# Patient Record
Sex: Male | Born: 1970 | Race: White | Hispanic: No | Marital: Married | Smoking: Current every day smoker
Health system: Southern US, Community
[De-identification: ages and names within clinical notes are randomized; demographics above are authoritative.]

## PROBLEM LIST (undated history)

## (undated) DIAGNOSIS — J449 Chronic obstructive pulmonary disease, unspecified: Secondary | ICD-10-CM

## (undated) HISTORY — PX: NECK DISSECTION: SUR422

## (undated) HISTORY — DX: Chronic obstructive pulmonary disease, unspecified: J44.9

## (undated) HISTORY — PX: BACK SURGERY: SHX140

---

## 2000-02-14 ENCOUNTER — Emergency Department (HOSPITAL_COMMUNITY): Admission: EM | Admit: 2000-02-14 | Discharge: 2000-02-14 | Payer: Self-pay | Admitting: Emergency Medicine

## 2000-02-14 ENCOUNTER — Encounter: Payer: Self-pay | Admitting: Emergency Medicine

## 2000-05-19 ENCOUNTER — Emergency Department (HOSPITAL_COMMUNITY): Admission: EM | Admit: 2000-05-19 | Discharge: 2000-05-19 | Payer: Self-pay | Admitting: Emergency Medicine

## 2001-09-01 ENCOUNTER — Emergency Department (HOSPITAL_COMMUNITY): Admission: EM | Admit: 2001-09-01 | Discharge: 2001-09-01 | Payer: Self-pay | Admitting: Emergency Medicine

## 2001-09-01 ENCOUNTER — Encounter: Payer: Self-pay | Admitting: Emergency Medicine

## 2002-08-13 ENCOUNTER — Encounter: Payer: Self-pay | Admitting: Emergency Medicine

## 2002-08-13 ENCOUNTER — Emergency Department (HOSPITAL_COMMUNITY): Admission: EM | Admit: 2002-08-13 | Discharge: 2002-08-13 | Payer: Self-pay

## 2003-05-21 ENCOUNTER — Emergency Department (HOSPITAL_COMMUNITY): Admission: EM | Admit: 2003-05-21 | Discharge: 2003-05-21 | Payer: Self-pay | Admitting: Emergency Medicine

## 2005-01-22 ENCOUNTER — Emergency Department (HOSPITAL_COMMUNITY): Admission: EM | Admit: 2005-01-22 | Discharge: 2005-01-22 | Payer: Self-pay | Admitting: Emergency Medicine

## 2005-04-04 ENCOUNTER — Emergency Department (HOSPITAL_COMMUNITY): Admission: EM | Admit: 2005-04-04 | Discharge: 2005-04-04 | Payer: Self-pay | Admitting: Family Medicine

## 2005-04-28 ENCOUNTER — Emergency Department (HOSPITAL_COMMUNITY): Admission: EM | Admit: 2005-04-28 | Discharge: 2005-04-28 | Payer: Self-pay | Admitting: Family Medicine

## 2005-09-20 ENCOUNTER — Emergency Department (HOSPITAL_COMMUNITY): Admission: EM | Admit: 2005-09-20 | Discharge: 2005-09-20 | Payer: Self-pay | Admitting: Family Medicine

## 2006-07-04 ENCOUNTER — Emergency Department (HOSPITAL_COMMUNITY): Admission: EM | Admit: 2006-07-04 | Discharge: 2006-07-04 | Payer: Self-pay | Admitting: Family Medicine

## 2006-08-11 ENCOUNTER — Emergency Department (HOSPITAL_COMMUNITY): Admission: EM | Admit: 2006-08-11 | Discharge: 2006-08-11 | Payer: Self-pay | Admitting: Family Medicine

## 2008-01-14 ENCOUNTER — Emergency Department (HOSPITAL_BASED_OUTPATIENT_CLINIC_OR_DEPARTMENT_OTHER): Admission: EM | Admit: 2008-01-14 | Discharge: 2008-01-14 | Payer: Self-pay | Admitting: Emergency Medicine

## 2008-04-23 ENCOUNTER — Emergency Department (HOSPITAL_BASED_OUTPATIENT_CLINIC_OR_DEPARTMENT_OTHER): Admission: EM | Admit: 2008-04-23 | Discharge: 2008-04-23 | Payer: Self-pay | Admitting: Emergency Medicine

## 2008-04-23 ENCOUNTER — Ambulatory Visit: Payer: Self-pay | Admitting: Interventional Radiology

## 2008-10-10 ENCOUNTER — Encounter (INDEPENDENT_AMBULATORY_CARE_PROVIDER_SITE_OTHER): Payer: Self-pay | Admitting: Orthopedic Surgery

## 2008-10-10 ENCOUNTER — Ambulatory Visit: Payer: Self-pay | Admitting: Diagnostic Radiology

## 2008-10-10 ENCOUNTER — Encounter: Payer: Self-pay | Admitting: Emergency Medicine

## 2008-10-10 ENCOUNTER — Inpatient Hospital Stay (HOSPITAL_COMMUNITY): Admission: EM | Admit: 2008-10-10 | Discharge: 2008-10-11 | Payer: Self-pay | Admitting: Physician Assistant

## 2009-10-09 ENCOUNTER — Emergency Department (HOSPITAL_BASED_OUTPATIENT_CLINIC_OR_DEPARTMENT_OTHER): Admission: EM | Admit: 2009-10-09 | Discharge: 2009-10-09 | Payer: Self-pay | Admitting: Emergency Medicine

## 2009-12-18 ENCOUNTER — Emergency Department (HOSPITAL_BASED_OUTPATIENT_CLINIC_OR_DEPARTMENT_OTHER): Admission: EM | Admit: 2009-12-18 | Discharge: 2009-12-18 | Payer: Self-pay | Admitting: Emergency Medicine

## 2009-12-31 ENCOUNTER — Emergency Department (HOSPITAL_BASED_OUTPATIENT_CLINIC_OR_DEPARTMENT_OTHER): Admission: EM | Admit: 2009-12-31 | Discharge: 2009-12-31 | Payer: Self-pay | Admitting: Emergency Medicine

## 2009-12-31 ENCOUNTER — Ambulatory Visit: Payer: Self-pay | Admitting: Diagnostic Radiology

## 2010-01-19 ENCOUNTER — Emergency Department (HOSPITAL_BASED_OUTPATIENT_CLINIC_OR_DEPARTMENT_OTHER): Admission: EM | Admit: 2010-01-19 | Discharge: 2010-01-19 | Payer: Self-pay | Admitting: Emergency Medicine

## 2010-02-24 ENCOUNTER — Encounter: Admission: RE | Admit: 2010-02-24 | Discharge: 2010-02-24 | Payer: Self-pay | Admitting: Internal Medicine

## 2010-07-11 ENCOUNTER — Other Ambulatory Visit (HOSPITAL_COMMUNITY): Payer: Self-pay | Admitting: Orthopedic Surgery

## 2010-07-11 ENCOUNTER — Encounter (HOSPITAL_COMMUNITY): Payer: Self-pay

## 2010-07-11 ENCOUNTER — Encounter (HOSPITAL_COMMUNITY)
Admission: RE | Admit: 2010-07-11 | Discharge: 2010-07-11 | Disposition: A | Discharge status: Home / Self Care | Payer: Medicaid Other | Source: Ambulatory Visit | Attending: Orthopedic Surgery | Admitting: Orthopedic Surgery

## 2010-07-11 ENCOUNTER — Ambulatory Visit (HOSPITAL_COMMUNITY)
Admission: RE | Admit: 2010-07-11 | Discharge: 2010-07-11 | Disposition: A | Discharge status: Home / Self Care | Payer: Medicaid Other | Source: Ambulatory Visit | Attending: Orthopedic Surgery | Admitting: Orthopedic Surgery

## 2010-07-11 DIAGNOSIS — M5412 Radiculopathy, cervical region: Secondary | ICD-10-CM

## 2010-07-11 DIAGNOSIS — Z01812 Encounter for preprocedural laboratory examination: Secondary | ICD-10-CM | POA: Insufficient documentation

## 2010-07-11 LAB — CBC
HCT: 43.1 % (ref 39.0–52.0)
Hemoglobin: 14.9 g/dL (ref 13.0–17.0)
RBC: 4.65 MIL/uL (ref 4.22–5.81)
RDW: 11.6 % (ref 11.5–15.5)
WBC: 8.3 10*3/uL (ref 4.0–10.5)

## 2010-07-11 LAB — SURGICAL PCR SCREEN: MRSA, PCR: NEGATIVE

## 2010-07-12 ENCOUNTER — Inpatient Hospital Stay (HOSPITAL_COMMUNITY): Payer: Medicaid Other

## 2010-07-12 ENCOUNTER — Ambulatory Visit (HOSPITAL_COMMUNITY): Payer: Medicaid Other

## 2010-07-12 ENCOUNTER — Ambulatory Visit (HOSPITAL_COMMUNITY)
Admission: RE | Admit: 2010-07-12 | Discharge: 2010-07-13 | Disposition: A | Discharge status: Home / Self Care | Payer: Medicaid Other | Source: Ambulatory Visit | Attending: Orthopedic Surgery | Admitting: Orthopedic Surgery

## 2010-07-12 DIAGNOSIS — Z23 Encounter for immunization: Secondary | ICD-10-CM | POA: Insufficient documentation

## 2010-07-12 DIAGNOSIS — F17200 Nicotine dependence, unspecified, uncomplicated: Secondary | ICD-10-CM | POA: Insufficient documentation

## 2010-07-12 DIAGNOSIS — J449 Chronic obstructive pulmonary disease, unspecified: Secondary | ICD-10-CM | POA: Insufficient documentation

## 2010-07-12 DIAGNOSIS — J4489 Other specified chronic obstructive pulmonary disease: Secondary | ICD-10-CM | POA: Insufficient documentation

## 2010-07-12 DIAGNOSIS — M4802 Spinal stenosis, cervical region: Secondary | ICD-10-CM | POA: Insufficient documentation

## 2010-07-16 NOTE — Op Note (Signed)
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  OPERATIVE PROCEDURE:  Anterior cervical diskectomy and fusion C6-7.  COMPLICATIONS:  None.  CONDITION:  Stable.  INSTRUMENTATION SYSTEM USED:  A Synthes anterior cervical vector plate 12 mm in length with 16-mm locking screws and 8-mm high lordotic Titan intervertebral titanium spacer packed with DBX with cortical cancellous chips.  This is a very pleasant 40 year old gentleman who has been having severe debilitating right arm pain and neck pain.  The arm pain is in the C7 distribution.  Multiple attempts at conservative management consisting of physical therapy, injection therapy, observation, and failed to alleviate any of his symptoms.  He continued to have debilitating pain. As a result, we elected to proceed with surgery.  All appropriate risks, benefits, and alternatives to surgery were discussed with the patient and consent was obtained.  OPERATIVE NOTE:  The patient was brought to the operating room, placed supine on the operating table.  After successful induction of general anesthesia  endotracheal intubation, TED SCDs were applied and the anterior cervical spine was prepped and draped in a standard fashion. Preoperative time-out was then done to confirm patient procedure and affected extremity.  Once this was done, I then proceeded with a standard left-sided Clementeen Graham approach to cervical spine via a transverse incision.  I palpated  the cricoid cartilage, made a transverse incision starting at the midline and proceeding towards the left.  Sharp dissection was carried out down to and through the platysma and then identified the medial border sternocleidomastoid, continued my dissection along the medial border.  I identified the omohyoid and I swept it medially along with the esophagus and trachea.  I protected them with a thyroid retractor and then using Kittner dissectors, mobilized the prevertebral fascia so I could see the anterior longitudinal ligament.  I visualized the carotid sheath and protected that laterally with a finger.  At this point, I then placed a needle into the C6-7 disk space and confirmed that I was at the appropriate level.  Once this was done, I mobilized the longus coli muscles with bipolar electrocautery from the midbody of C6 to the midbody of C7 bilaterally.  I then placed Caspar retracting blades underneath the longus coli muscle and expanded them.  I did deflate and reinflate the endotracheal cuff during the expansion of the retractors.  Two distraction pins were placed, one in the body of C6, the other one in C7, distracted the 6-7 disk space.  An annulotomy was done using a 15-blade scalpel and then I used a combination of pituitary rongeurs, curettes, and Kerrison rongeurs to remove all the disk material.  I then undercut beneath the uncovertebral joint to trim down the osteophyte using a 2- and 1-mm Kerrison.  I did dissect through the posterior longitudinal ligament and used a 1-mm Kerrison to resect the posterior longitudinal ligament, so I could expose the anterior thecal sac and noted to have an adequate decompression.  Once the decompression was completed, I then rasped the endplates, so I had bleeding subchondral bone measured with trial devices.  The 8-mm lordotic spacer provided the best fit.  I then obtained this, packed with cortical cancellous bone chips, mixed with DBX and  malleted it to the appropriate depth.  Once this was done, I took a 12-mm anterior cervical vector plate and secured the plate to the bodies of C6 and C7 with 16-mm screws.  All screws were tight, torqued down to [employer] specifications.  The retractors were removed from the wound.  I checked these to ensure the esophagus was not entrapped beneath the plate, irrigated copiously with normal saline and then returned the trachea and esophagus to midline.  Final x-rays demonstrated satisfactory positioning of the hardware.  The platysma was reapproximated with interrupted 2-0 Vicryl sutures, 3-0 Monocryl for the skin.  Steri-Strips and dry dressing were applied.  The patient was extubated and transferred to PACU without incident.  At the end of the case, all needle and sponge counts were correct.     Alvy Beal, MD     DDB/MEDQ  D:  07/12/2010  T:  07/13/2010  Job:  [id number]  Electronically Signed by Venita Lick MD on 07/16/2010 05:21:12 PM

## 2010-07-26 LAB — SEDIMENTATION RATE: Sed Rate: 0 mm/hr (ref 0–16)

## 2010-07-27 LAB — HEPATIC FUNCTION PANEL
ALT: 13 U/L (ref 0–53)
AST: 17 U/L (ref 0–37)
Albumin: 3.4 g/dL — ABNORMAL LOW (ref 3.5–5.2)
Alkaline Phosphatase: 51 U/L (ref 39–117)
Bilirubin, Direct: 0 mg/dL (ref 0.0–0.3)
Total Bilirubin: 0.6 mg/dL (ref 0.3–1.2)

## 2010-07-27 LAB — BASIC METABOLIC PANEL
BUN: 9 mg/dL (ref 6–23)
Chloride: 106 mEq/L (ref 96–112)
Potassium: 4 mEq/L (ref 3.5–5.1)
Sodium: 140 mEq/L (ref 135–145)

## 2010-07-27 LAB — CBC
HCT: 41.5 % (ref 39.0–52.0)
Hemoglobin: 14.1 g/dL (ref 13.0–17.0)
MCV: 95.8 fL (ref 78.0–100.0)
RDW: 11.4 % — ABNORMAL LOW (ref 11.5–15.5)
WBC: 7.7 10*3/uL (ref 4.0–10.5)

## 2010-07-27 LAB — URINALYSIS, ROUTINE W REFLEX MICROSCOPIC
Bilirubin Urine: NEGATIVE
Glucose, UA: NEGATIVE mg/dL
Hgb urine dipstick: NEGATIVE
Ketones, ur: NEGATIVE mg/dL
Specific Gravity, Urine: 1.017 (ref 1.005–1.030)
pH: 6 (ref 5.0–8.0)

## 2010-07-27 LAB — DIFFERENTIAL
Basophils Absolute: 0.2 10*3/uL — ABNORMAL HIGH (ref 0.0–0.1)
Eosinophils Relative: 3 % (ref 0–5)
Lymphocytes Relative: 33 % (ref 12–46)
Lymphs Abs: 2.5 10*3/uL (ref 0.7–4.0)
Monocytes Absolute: 0.4 10*3/uL (ref 0.1–1.0)
Monocytes Relative: 5 % (ref 3–12)

## 2010-07-30 LAB — DIFFERENTIAL
Basophils Absolute: 0 10*3/uL (ref 0.0–0.1)
Basophils Relative: 1 % (ref 0–1)
Eosinophils Absolute: 0.2 10*3/uL (ref 0.0–0.7)
Eosinophils Relative: 2 % (ref 0–5)
Lymphs Abs: 1.8 10*3/uL (ref 0.7–4.0)
Neutrophils Relative %: 67 % (ref 43–77)

## 2010-07-30 LAB — ROCKY MTN SPOTTED FVR AB, IGM-BLOOD: RMSF IgM: 0.11 IV (ref 0.00–0.89)

## 2010-07-30 LAB — URINE CULTURE: Culture: NO GROWTH

## 2010-07-30 LAB — BASIC METABOLIC PANEL
BUN: 13 mg/dL (ref 6–23)
Chloride: 109 mEq/L (ref 96–112)
Creatinine, Ser: 0.9 mg/dL (ref 0.4–1.5)
Glucose, Bld: 126 mg/dL — ABNORMAL HIGH (ref 70–99)
Potassium: 4.3 mEq/L (ref 3.5–5.1)

## 2010-07-30 LAB — CBC
HCT: 42.9 % (ref 39.0–52.0)
MCV: 96 fL (ref 78.0–100.0)
Platelets: 233 10*3/uL (ref 150–400)
RDW: 11.8 % (ref 11.5–15.5)
WBC: 8.1 10*3/uL (ref 4.0–10.5)

## 2010-07-30 LAB — URINALYSIS, ROUTINE W REFLEX MICROSCOPIC
Glucose, UA: NEGATIVE mg/dL
Hgb urine dipstick: NEGATIVE
Protein, ur: NEGATIVE mg/dL
Specific Gravity, Urine: 1.011 (ref 1.005–1.030)
Urobilinogen, UA: 0.2 mg/dL (ref 0.0–1.0)

## 2010-07-30 LAB — ROCKY MTN SPOTTED FVR AB, IGG-BLOOD: RMSF IgG: 0.12 IV

## 2010-08-20 LAB — CBC
HCT: 36.1 % — ABNORMAL LOW (ref 39.0–52.0)
Hemoglobin: 12.3 g/dL — ABNORMAL LOW (ref 13.0–17.0)
MCHC: 34.2 g/dL (ref 30.0–36.0)
MCV: 95 fL (ref 78.0–100.0)
Platelets: 187 10*3/uL (ref 150–400)
RBC: 3.8 MIL/uL — ABNORMAL LOW (ref 4.22–5.81)
RDW: 11.9 % (ref 11.5–15.5)
WBC: 8.3 10*3/uL (ref 4.0–10.5)

## 2010-08-21 LAB — DIFFERENTIAL
Basophils Absolute: 0.3 10*3/uL — ABNORMAL HIGH (ref 0.0–0.1)
Basophils Relative: 2 % — ABNORMAL HIGH (ref 0–1)
Eosinophils Absolute: 0.6 10*3/uL (ref 0.0–0.7)
Monocytes Absolute: 1.5 10*3/uL — ABNORMAL HIGH (ref 0.1–1.0)
Monocytes Relative: 10 % (ref 3–12)
Neutrophils Relative %: 66 % (ref 43–77)

## 2010-08-21 LAB — ANAEROBIC CULTURE

## 2010-08-21 LAB — CBC
Hemoglobin: 14.4 g/dL (ref 13.0–17.0)
MCHC: 34.5 g/dL (ref 30.0–36.0)
MCV: 94.5 fL (ref 78.0–100.0)
RBC: 4.43 MIL/uL (ref 4.22–5.81)
RDW: 11.3 % — ABNORMAL LOW (ref 11.5–15.5)

## 2010-08-21 LAB — BASIC METABOLIC PANEL
CO2: 28 mEq/L (ref 19–32)
Chloride: 109 mEq/L (ref 96–112)
Creatinine, Ser: 1.2 mg/dL (ref 0.4–1.5)
GFR calc Af Amer: >60 mL/min (ref >60)
Glucose, Bld: 94 mg/dL (ref 70–99)

## 2010-08-21 LAB — WOUND CULTURE

## 2010-08-21 LAB — GRAM STAIN

## 2010-09-25 NOTE — Consult Note (Signed)
NAMEALDEAN, PIPE                 ACCOUNT NO.:  [id number]   MEDICAL RECORD NO.:  [id number]          PATIENT TYPE:  INP   LOCATION:  5017                         FACILITY:  [facility]   PHYSICIAN:  Artist Pais. Mina Marble, M.D.DATE OF BIRTH:  1970/07/12   DATE OF CONSULTATION:  10/10/2008  DATE OF DISCHARGE:                                 CONSULTATION   PHYSICIAN REQUESTING CONSULTATION:  Azalia Bilis, MD   The patient was actually transferred from [facility].   Mr. Terlizzi is a 40 year old right-hand dominant male, who was doing some  work with wood and putty-type materials and sustained a puncture wound  to the volar aspect of his dominant right long finger and presents with  a 48-hour history of pain and swelling of the flexor sheath.  He is 40  years old.   ALLERGIES:  He has an allergy to PENICILLIN.   He is not diabetic.   MEDICATIONS:  He is currently taking no medications.   SOCIAL HISTORY:  He smokes 2 packs a day.  No alcohol use.  No drug use.   FAMILY MEDICAL HISTORY:  Noncontributory.   PHYSICAL EXAMINATION:  GENERAL:  A well-developed and well-nourished  male, pleasant, alert, and oriented x3.  NEUROLOGIC:  He has pain and swelling of the right long finger from the  DIP joint to the middle of the proximal phalanx.  Kanaval signs are  positive.  Pain with passive extension, pain over the flexor sheath  itself, swelling, etc.   LABORATORY DATA:  He has a white count at 15.6, and his x-rays show soft  tissue swelling only.   IMPRESSION:  A 40 year old male with an obvious flexor sheath infection,  dominant right long finger.  I discussed with both Mr. Lackey and his  wife.  Recommendations at this point in time, an emergent trip to the  operating room for I and D culturing, IV antibiotics, possible re-I and  D as necessary versus primary closure over a drain versus placement of  an indwelling catheter.  They understood the risks and benefits and the  need  for emergent surgery.  We will proceed as soon as possible.      Artist Pais Mina Marble, M.D.  Electronically Signed     MAW/MEDQ  D:  10/10/2008  T:  10/11/2008  Job:  [id number]

## 2010-09-25 NOTE — Op Note (Signed)
NAMEALFARD, COCHRANE                 ACCOUNT NO.:  [id number]   MEDICAL RECORD NO.:  [id number]          PATIENT TYPE:  INP   LOCATION:  5017                         FACILITY:  [facility]   PHYSICIAN:  Artist Pais. Weingold, M.D.DATE OF BIRTH:  06/19/1970   DATE OF PROCEDURE:  10/10/2008  DATE OF DISCHARGE:                               OPERATIVE REPORT   PREOPERATIVE DIAGNOSIS:  Right long finger flexor sheath infection.   POSTOPERATIVE DIAGNOSIS:  Right long finger flexor sheath infection.   PROCEDURE:  I and D of above with culturing.   SURGEON:  Artist Pais. Mina Marble, MD   ASSISTANT:  None.   ANESTHESIA:  General.   TOURNIQUET TIME:  26 minutes.   No complications.   No drains.   OPERATIVE REPORT:  The patient was taken to the operating suite.  After  the induction of adequate general anesthesia, right upper extremity was  prepped and draped in sterile fashion.  Esmarch was used to exsanguinate  the limb.  Tourniquet was then inflated to 250 mmHg.  At this point in  time, a Brunner incision was made over the middle phalanx of the right  long finger, extending proximally and distally to the midline.  Skin was  incised.  Cloudy fluid was encountered off the flexor sheath.  This was  cultured.  Dissection was carried down the flexor sheath.  The sheath  was opened between the A2 and A4 pulleys.  This fluid was also cultured,  and some of the sheath was sent for pathologic confirmation.  The wound  was thoroughly irrigated with a liter of normal saline and was loosely  closed over a drain.  A red vessel loop with 4-0 Vicryl Rapide suture.  Xeroform, 4 x 4, fluffs, and a compressive dressing was applied.  The  patient tolerated procedures well, went to recovery room in stable  fashion.      Artist Pais Mina Marble, M.D.  Electronically Signed     MAW/MEDQ  D:  10/10/2008  T:  10/11/2008  Job:  [id number]

## 2010-09-28 NOTE — Discharge Summary (Signed)
NAMEJOSEPHMICHAEL, LISENBEE                 ACCOUNT NO.:  [id number]   MEDICAL RECORD NO.:  [id number]          PATIENT TYPE:  INP   LOCATION:  5017                         FACILITY:  [facility]   PHYSICIAN:  Artist Pais. Weingold, M.D.DATE OF BIRTH:  10/16/1970   DATE OF ADMISSION:  10/10/2008  DATE OF DISCHARGE:  10/11/2008                               DISCHARGE SUMMARY   PRINCIPAL DIAGNOSIS:  Right long finger infection, flexor sheath.   SECONDARY DIAGNOSES:  None.   PRINCIPAL PROCEDURE:  Incision and drainage, right long finger, flexor  sheath.   CONDITION ON DISCHARGE:  Stable.  Discharged to home on antibiotics.   HISTORY OF PRESENT ILLNESS:  Mr. Davin Archuletta is a 40 year old male who  presented to the emergency room on Oct 10, 2008 with 48-hour history of  pain and swelling of the right long finger.  He had a puncture wound to  his right long finger over the middle phalangeal level with increased  pain and swelling.   PAST MEDICAL HISTORY:  Noncontributory.   SOCIAL HISTORY:  Occasional alcohol and tobacco user.   ALLERGIES:  PENICILLIN.   MEDICATIONS:  No other medications.   REVIEW OF SYMPTOMS:  Negative.   PHYSICAL EXAMINATION:  Swelling with Kanaval signs.   LABORATORY DATA:  White count 15,000.  A x-ray showed soft tissue  swelling.  He was taken to the operating room where he underwent I and D  and culturing of his finger on October 11, 2008.  The white count was down  to 8000.  Gram-stain showed no organisms.   DISPOSITION:  He was discharged on p.o. Cipro.  He was to follow up in  my office in 5-7 days.      Artist Pais Mina Marble, M.D.  Electronically Signed     Artist Pais. Mina Marble, M.D.  Electronically Signed    MAW/MEDQ  D:  11/16/2008  T:  11/17/2008  Job:  [id number]

## 2010-12-24 ENCOUNTER — Ambulatory Visit
Admission: RE | Admit: 2010-12-24 | Discharge: 2010-12-24 | Disposition: A | Discharge status: Home / Self Care | Payer: Medicaid Other | Source: Ambulatory Visit | Attending: Orthopedic Surgery | Admitting: Orthopedic Surgery

## 2010-12-24 ENCOUNTER — Other Ambulatory Visit: Payer: Self-pay | Admitting: Orthopedic Surgery

## 2010-12-24 DIAGNOSIS — R52 Pain, unspecified: Secondary | ICD-10-CM

## 2014-12-05 ENCOUNTER — Encounter: Payer: Self-pay | Admitting: Neurology

## 2014-12-05 ENCOUNTER — Ambulatory Visit (INDEPENDENT_AMBULATORY_CARE_PROVIDER_SITE_OTHER): Payer: Medicaid Other | Admitting: Neurology

## 2014-12-05 VITALS — BP 80/58 | HR 88 | Resp 18 | Ht 74.5 in | Wt 185.8 lb

## 2014-12-05 DIAGNOSIS — R42 Dizziness and giddiness: Secondary | ICD-10-CM | POA: Diagnosis not present

## 2014-12-05 DIAGNOSIS — I951 Orthostatic hypotension: Secondary | ICD-10-CM

## 2014-12-05 MED ORDER — SERTRALINE HCL 50 MG PO TABS: 50.0000 mg | ORAL_TABLET | Freq: Every day | ORAL | Status: DC

## 2014-12-05 NOTE — Addendum Note (Signed)
Addended by: Glean Salen E on: 12/05/2014 09:24 AM   Modules accepted: Orders

## 2014-12-05 NOTE — Patient Instructions (Signed)
The dizzy spells may be migraines 1.  We will start sertraline  at bedtime to try and suppress them.  Call in 4 weeks with update and we can increase dose if needed 2.  Discuss with O'Buch regarding fluctuating blood pressure 3.  Recommend that O'Buch order MRI of brain with and without contrast with internal auditory canals 4.  Follow up in 2 months.
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
## 2015-02-07 ENCOUNTER — Ambulatory Visit: Payer: Medicaid Other | Admitting: Neurology

## 2015-02-20 ENCOUNTER — Ambulatory Visit: Payer: Medicaid Other | Admitting: Neurology

## 2015-03-01 ENCOUNTER — Ambulatory Visit: Payer: Medicaid Other | Admitting: Neurology

## 2016-07-23 ENCOUNTER — Emergency Department (HOSPITAL_COMMUNITY)
Admission: EM | Admit: 2016-07-23 | Discharge: 2016-07-23 | Disposition: A | Discharge status: Home / Self Care | Payer: Medicaid Other | Attending: Emergency Medicine | Admitting: Emergency Medicine

## 2016-07-23 ENCOUNTER — Emergency Department (HOSPITAL_COMMUNITY): Payer: Medicaid Other

## 2016-07-23 ENCOUNTER — Encounter (HOSPITAL_COMMUNITY): Payer: Self-pay

## 2016-07-23 DIAGNOSIS — S82442A Displaced spiral fracture of shaft of left fibula, initial encounter for closed fracture: Secondary | ICD-10-CM | POA: Diagnosis not present

## 2016-07-23 DIAGNOSIS — S8992XA Unspecified injury of left lower leg, initial encounter: Secondary | ICD-10-CM | POA: Diagnosis present

## 2016-07-23 DIAGNOSIS — Y990 Civilian activity done for income or pay: Secondary | ICD-10-CM | POA: Diagnosis not present

## 2016-07-23 DIAGNOSIS — J449 Chronic obstructive pulmonary disease, unspecified: Secondary | ICD-10-CM | POA: Insufficient documentation

## 2016-07-23 DIAGNOSIS — W010XXA Fall on same level from slipping, tripping and stumbling without subsequent striking against object, initial encounter: Secondary | ICD-10-CM | POA: Diagnosis not present

## 2016-07-23 DIAGNOSIS — S82245A Nondisplaced spiral fracture of shaft of left tibia, initial encounter for closed fracture: Secondary | ICD-10-CM

## 2016-07-23 DIAGNOSIS — Y9389 Activity, other specified: Secondary | ICD-10-CM | POA: Diagnosis not present

## 2016-07-23 DIAGNOSIS — Y92009 Unspecified place in unspecified non-institutional (private) residence as the place of occurrence of the external cause: Secondary | ICD-10-CM | POA: Diagnosis not present

## 2016-07-23 DIAGNOSIS — Z79899 Other long term (current) drug therapy: Secondary | ICD-10-CM | POA: Insufficient documentation

## 2016-07-23 DIAGNOSIS — F17200 Nicotine dependence, unspecified, uncomplicated: Secondary | ICD-10-CM | POA: Diagnosis not present

## 2016-07-23 MED ORDER — OXYCODONE HCL 5 MG PO TABS
15.0000 mg | ORAL_TABLET | Freq: Once | ORAL | Status: AC
  Administered 2016-07-23: 15 mg via ORAL
  Filled 2016-07-23: qty 3

## 2016-07-23 MED ORDER — OXYCODONE-ACETAMINOPHEN 5-325 MG PO TABS
2.0000 | ORAL_TABLET | Freq: Once | ORAL | Status: AC
  Administered 2016-07-23: 2 via ORAL
  Filled 2016-07-23: qty 2

## 2016-07-23 NOTE — Discharge Instructions (Signed)
You were seen in the ED today with a lower leg fracture. We have placed a splint which you should keep clean and dry. Use the crutches provided and do not place any weight on the leg. Call the orthopedic surgeon today to schedule an appointment for the next week. Take your home pain medication and keep the leg elevated above your heart when resting at home.   Return with the ED with any sudden worsening pain in the leg or any additional injuries.

## 2016-07-23 NOTE — Progress Notes (Signed)
Orthopedic Tech Progress Note Patient Details:  Mark Parks 10/04/1970 [id number]  Ortho Devices Type of Ortho Device: Crutches, Post (short leg) splint Ortho Device/Splint Location: Applied Crutches and Short Leg Splint to pt left foot/Leg  Pt tolerated well. Family at bedside.  Pt informed he previously used crutches. Left Foot/Leg Ortho Device/Splint Interventions: Application, Adjustment   Mark Parks, Mark Parks 07/23/2016, 12:48 PM

## 2016-07-23 NOTE — ED Provider Notes (Signed)
Emergency Department Provider Note   I have reviewed the triage vital signs and the nursing notes.   HISTORY  Chief Complaint Ankle Pain and Fall   HPI Mark Parks is a 46 y.o. male with PMH of COPD and chronic pain presents to the emergency department for evaluation of left ankle and lower leg pain. The patient states that there was a wet spot on the floor when he is leaving for work this morning. He slipped and fell at which point he hurt his left ankle "pop" and felt severe pain in that area. He states that the pain is running up his calf. He denies any head trauma or loss of consciousness. The pain is severe, sharp, and worse with movement. Denies any preceding chest pain, palpitations, shortness of breath. No pain in other body parts. He takes Oxycodone 15 mg daily with last dose at 5AM.     Past Medical History:  Diagnosis Date  . COPD (chronic obstructive pulmonary disease) [facility](HCC)     Patient Active Problem List   Diagnosis Date Noted  . Vertigo 12/05/2014    Past Surgical History:  Procedure Laterality Date  . BACK SURGERY    . NECK DISSECTION      Current Outpatient Rx  . Order #: [id number] Class: Historical Med  . Order #: [id number] Class: Historical Med  . Order #: [id number] Class: Historical Med  . Order #: [id number] Class: Historical Med  . Order #: [id number] Class: Historical Med  . Order #: [id number] Class: Historical Med  . Order #: [id number] Class: Historical Med  . Order #: [id number] Class: Historical Med  . Order #: [id number] Class: Normal    Allergies Penicillins  Family History  Problem Relation Age of Onset  . Cancer Mother     liver and stomach   . Heart failure Mother   . Hypertension Mother   . Diabetes Father   . Hypertension Father     Social History Social History  Substance Use Topics  . Smoking status: Current Every Day Smoker  . Smokeless tobacco: Never Used     Comment: patient is aware he needs to quit   . Alcohol use No     Review of Systems  Constitutional: No fever/chills Eyes: No visual changes. ENT: No sore throat. Cardiovascular: Denies chest pain. Respiratory: Denies shortness of breath. Gastrointestinal: No abdominal pain.  No nausea, no vomiting.  No diarrhea.  No constipation. Genitourinary: Negative for dysuria. Musculoskeletal: Negative for back pain. Positive left ankle and lower leg pain.  Skin: Negative for rash. Neurological: Negative for headaches, focal weakness or numbness.  10-point ROS otherwise negative.  ____________________________________________   PHYSICAL EXAM:  VITAL SIGNS: ED Triage Vitals  Enc Vitals Group     BP 07/23/16 0955 127/71     Pulse Rate 07/23/16 0955 61     Resp 07/23/16 0955 18     Temp 07/23/16 0955 98.7 F (37.1 C)     Temp Source 07/23/16 0955 Oral     SpO2 07/23/16 0955 97 %     Weight 07/23/16 0956 180 lb (81.6 kg)     Height 07/23/16 0956 6' (1.829 m)     Pain Score 07/23/16 0957 10   Constitutional: Alert and oriented. Well appearing and in no acute distress. Eyes: Conjunctivae are normal. Head: Atraumatic. Nose: No congestion/rhinnorhea. Mouth/Throat: Mucous membranes are moist.  Neck: No stridor.   Cardiovascular: Normal rate, regular rhythm. Good peripheral circulation. Grossly normal heart sounds.   Respiratory: Normal respiratory effort.  No retractions.  Lungs CTAB. Gastrointestinal: Soft and nontender. No distention.  Musculoskeletal: No gross deformities of extremities. Tenderness to palpation over the anterior ankle and posterior lower left leg. Normal pulses and sensation in the LLE.  Neurologic:  Normal speech and language. No gross focal neurologic deficits are appreciated.  Skin:  Skin is warm, dry and intact. No rash noted. No evidence of open fracture.  Psychiatric: Mood and affect are normal. Speech and behavior are normal.  ____________________________________________  RADIOLOGY  Dg Tibia/fibula Left  Result  Date: 07/23/2016 CLINICAL DATA:  Left ankle pain and swelling secondary to a fall this morning. EXAM: LEFT TIBIA AND FIBULA - 2 VIEW COMPARISON:  None. FINDINGS: There is a slightly displaced spiral fracture of the distal fibula. The proximal fibula is intact. Tibia is normal. There is an ankle joint effusion. No knee joint effusion. IMPRESSION: Spiral fracture of the distal fibula.  Ankle effusion. Electronically Signed   By: Francene Boyers M.D.   On: 07/23/2016 10:51   Dg Ankle Complete Left  Result Date: 07/23/2016 CLINICAL DATA:  Left ankle pain and swelling secondary to a fall this morning. EXAM: LEFT ANKLE COMPLETE - 3+ VIEW COMPARISON:  None. FINDINGS: There is a slightly displaced spiral fracture of the distal fibula. There is an ankle joint effusion. The distal tibia and talus are intact. Soft tissue swelling around the ankle. IMPRESSION: Spiral fracture of the distal fibula.  Ankle joint effusion. Electronically Signed   By: Francene Boyers M.D.   On: 07/23/2016 10:50    ____________________________________________   PROCEDURES  Procedure(s) performed:   Procedures  None ____________________________________________   INITIAL IMPRESSION / ASSESSMENT AND PLAN / ED COURSE  Pertinent labs & imaging results that were available during my care of the patient were reviewed by me and considered in my medical decision making (see chart for details).  Patient resents to the emergency department for evaluation of left ankle pain after slipping on the wet floor. He has no obvious deformity. He has tenderness to palpation of the ankle and lower leg. No evidence of open fracture. No obvious deformity. Plan for plain films of the left tib-fib and ankle. Will provide oral pain medication at this time.   11:00 AM Patient with spiral fracture of the left tibia. No displacement. Will provide splint and keep NWB until seen by orthopedics in the office. He will call to schedule appointment in the next  week.   At this time, I do not feel there is any life-threatening condition present. I have reviewed and discussed all results (EKG, imaging, lab, urine as appropriate), exam findings with patient. I have reviewed nursing notes and appropriate previous records.  I feel the patient is safe to be discharged home without further emergent workup. Discussed usual and customary return precautions. Patient and family (if present) verbalize understanding and are comfortable with this plan.  Patient will follow-up with their primary care provider. If they do not have a primary care provider, information for follow-up has been provided to them. All questions have been answered.  ____________________________________________  FINAL CLINICAL IMPRESSION(S) / ED DIAGNOSES  Final diagnoses:  Closed nondisplaced spiral fracture of shaft of left tibia, initial encounter     MEDICATIONS GIVEN DURING THIS VISIT:  Medications  oxyCODONE-acetaminophen (PERCOCET/ROXICET) 5-325 MG per tablet 2 tablet (2 tablets Oral Given 07/23/16 1014)     NEW OUTPATIENT MEDICATIONS STARTED DURING THIS VISIT:  None   Note:  This document was prepared using Dragon voice recognition software and may include  unintentional dictation errors.  Alona Bene, MD Emergency Medicine   Maia Plan, MD 07/23/16 [phone]

## 2016-07-23 NOTE — ED Triage Notes (Signed)
Pt. Slipped on his floor at home.  Twisted his lt. Ankle and felt a pop.  Denies any other pain.  Pt. Has chronic back pain.  Lt. Ankle has no deformity, +Radial pulse.  Pain is in the back of his ankle radiates into his calf. PT. Can wiggle his toes slighlty due to pain.  Alert and oriented X4.  Skin is pink, warm and dry.

## 2019-06-02 ENCOUNTER — Emergency Department (HOSPITAL_BASED_OUTPATIENT_CLINIC_OR_DEPARTMENT_OTHER)
Admission: EM | Admit: 2019-06-02 | Discharge: 2019-06-02 | Disposition: A | Discharge status: Home / Self Care | Payer: Medicaid Other | Attending: Emergency Medicine | Admitting: Emergency Medicine

## 2019-06-02 ENCOUNTER — Encounter (HOSPITAL_BASED_OUTPATIENT_CLINIC_OR_DEPARTMENT_OTHER): Payer: Self-pay | Admitting: Emergency Medicine

## 2019-06-02 ENCOUNTER — Other Ambulatory Visit: Payer: Self-pay

## 2019-06-02 DIAGNOSIS — T1501XA Foreign body in cornea, right eye, initial encounter: Secondary | ICD-10-CM

## 2019-06-02 DIAGNOSIS — Z880 Allergy status to penicillin: Secondary | ICD-10-CM | POA: Insufficient documentation

## 2019-06-02 DIAGNOSIS — Y9389 Activity, other specified: Secondary | ICD-10-CM | POA: Diagnosis not present

## 2019-06-02 DIAGNOSIS — X58XXXA Exposure to other specified factors, initial encounter: Secondary | ICD-10-CM | POA: Diagnosis not present

## 2019-06-02 DIAGNOSIS — Y999 Unspecified external cause status: Secondary | ICD-10-CM | POA: Insufficient documentation

## 2019-06-02 DIAGNOSIS — F17210 Nicotine dependence, cigarettes, uncomplicated: Secondary | ICD-10-CM | POA: Insufficient documentation

## 2019-06-02 DIAGNOSIS — S0501XA Injury of conjunctiva and corneal abrasion without foreign body, right eye, initial encounter: Secondary | ICD-10-CM

## 2019-06-02 DIAGNOSIS — Y929 Unspecified place or not applicable: Secondary | ICD-10-CM | POA: Diagnosis not present

## 2019-06-02 MED ORDER — TETRACAINE HCL 0.5 % OP SOLN
2.0000 [drp] | Freq: Once | OPHTHALMIC | Status: AC
  Administered 2019-06-02: 06:00:00 2 [drp] via OPHTHALMIC
  Filled 2019-06-02: qty 4

## 2019-06-02 MED ORDER — FLUORESCEIN SODIUM 1 MG OP STRP
ORAL_STRIP | OPHTHALMIC | Status: AC
  Filled 2019-06-02: qty 1

## 2019-06-02 MED ORDER — OFLOXACIN 0.3 % OP SOLN: 1.0000 [drp] | OPHTHALMIC | Status: DC

## 2019-06-02 NOTE — ED Triage Notes (Signed)
Pt reports having a piece of steel in right eye since last night.

## 2019-06-02 NOTE — ED Provider Notes (Addendum)
[facility] [facility] Provider Note: Georgena Spurling, MD, FACEP  CSN: [id number] MRN: [id number] ARRIVAL: 06/02/19 at [place]: [place]  Foreign Body in [place]  06/02/19 5:56 AM Mark Parks is a 49 y.o. male who was using a grinder yesterday evening about 10 PM when speck of steel got into his right eye.  He is having pain in his right eye which he rates as a 10 out of 10.  He describes the pain as feeling like something is in his eye.  It is worse with exposure to light.  His vision is somewhat blurred.  That eye is watering.  He is on methadone chronically.   Past Medical History:  Diagnosis Date  . COPD (chronic obstructive pulmonary disease) ([place])     Past Surgical History:  Procedure Laterality Date  . BACK SURGERY    . NECK DISSECTION      Family History  Problem Relation Age of Onset  . Cancer Mother        liver and stomach   . Heart failure Mother   . Hypertension Mother   . Diabetes Father   . Hypertension Father     Social History   Tobacco Use  . Smoking status: Current Every Day Smoker  . Smokeless tobacco: Never Used  . Tobacco comment: patient is aware he needs to quit   Substance Use Topics  . Alcohol use: No    Alcohol/week: 0.0 standard drinks  . Drug use: No    Prior to Admission medications   Medication Sig Start Date End Date Taking? Authorizing Provider  METHADONE HCL PO Take by mouth.   Yes [provider]    Allergies Penicillins   REVIEW OF SYSTEMS  Negative except as noted here or in the History of Present Illness.   PHYSICAL EXAMINATION  Initial Vital Signs Blood pressure (!) 134/96, pulse 60, temperature (!) 97.5 F (36.4 C), temperature source Oral, resp. rate 16, height 6\' 2"  (1.88 m), weight 81.6 kg, SpO2 94 %.  Examination General: Well-developed, well-nourished male in no acute distress; appearance consistent with age of record HENT: normocephalic;  atraumatic Eyes: pupils equal, round and reactive to light; extraocular muscles intact; foreign body lower right cornea; right eye watering but without conjunctival injection Neck: supple Heart: regular rate and rhythm Lungs: clear to auscultation bilaterally Abdomen: soft; nondistended; nontender; bowel sounds present Extremities: No deformity; full range of motion Neurologic: Awake, alert and oriented; motor function intact in all extremities and symmetric; no facial droop Skin: Warm and dry Psychiatric: Normal mood and affect   RESULTS  Summary of this visit's results, reviewed and interpreted by myself:   EKG Interpretation  Date/Time:    Ventricular Rate:    PR Interval:    QRS Duration:   QT Interval:    QTC Calculation:   R Axis:     Text Interpretation:        Laboratory Studies: No results found for this or any previous visit (from the past 24 hour(s)). Imaging Studies: No results found.  ED COURSE and MDM  Nursing notes, initial and subsequent vitals signs, including pulse oximetry, reviewed and interpreted by myself.  Vitals:   06/02/19 0539 06/02/19 0541  BP:  (!) 134/96  Pulse:  60  Resp:  16  Temp:  (!) 97.5 F (36.4 C)  TempSrc:  Oral  SpO2:  94%  Weight: 81.6 kg   Height: 6\' 2"  (  1.88 m)    Medications  fluorescein 1 MG ophthalmic strip (has no administration in time range)  ofloxacin (OCUFLOX) 0.3 % ophthalmic solution 1 drop (has no administration in time range)  tetracaine (PONTOCAINE) 0.5 % ophthalmic solution 2 drop (2 drops Right Eye Given 06/02/19 0547)   The foreign body was removed as described below and the patient was started on antibiotic eyedrops.  He was advised to follow-up with an optometrist or ophthalmologist later today for a complete eye examination.  PROCEDURES  Procedures  FOREIGN BODY REMOVAL The patient's right eye was anesthetized with 2 drops of tetracaine 0.5%.  A foreign object consistent with a speck of steel was  seen on the right lower cornea.  This was removed gently with a cotton swab.  The right eye was then stained with fluorescein and a corneal abrasion was seen in the area surrounding the foreign body.  It is unclear if a rust ring remains.  The patient tolerated this well and there were no immediate complications.  ED DIAGNOSES     ICD-10-CM   1. Foreign body of right cornea, initial encounter  T15.01XA   2. Abrasion of right cornea, initial encounter  S05.01XA        Demetries Coia, Jonny Ruiz, MD 06/02/19 4166    Paula Libra, MD 06/02/19 [phone]

## 2021-08-28 ENCOUNTER — Encounter (HOSPITAL_BASED_OUTPATIENT_CLINIC_OR_DEPARTMENT_OTHER): Payer: Self-pay | Admitting: Emergency Medicine

## 2021-08-28 ENCOUNTER — Emergency Department (HOSPITAL_BASED_OUTPATIENT_CLINIC_OR_DEPARTMENT_OTHER): Payer: Medicaid Other

## 2021-08-28 ENCOUNTER — Emergency Department (HOSPITAL_BASED_OUTPATIENT_CLINIC_OR_DEPARTMENT_OTHER)
Admission: EM | Admit: 2021-08-28 | Discharge: 2021-08-29 | Disposition: A | Discharge status: Home / Self Care | Payer: Medicaid Other | Attending: Emergency Medicine | Admitting: Emergency Medicine

## 2021-08-28 ENCOUNTER — Other Ambulatory Visit: Payer: Self-pay

## 2021-08-28 DIAGNOSIS — S2231XA Fracture of one rib, right side, initial encounter for closed fracture: Secondary | ICD-10-CM | POA: Insufficient documentation

## 2021-08-28 DIAGNOSIS — S299XXA Unspecified injury of thorax, initial encounter: Secondary | ICD-10-CM | POA: Diagnosis present

## 2021-08-28 DIAGNOSIS — Z87891 Personal history of nicotine dependence: Secondary | ICD-10-CM | POA: Diagnosis not present

## 2021-08-28 DIAGNOSIS — J441 Chronic obstructive pulmonary disease with (acute) exacerbation: Secondary | ICD-10-CM | POA: Insufficient documentation

## 2021-08-28 DIAGNOSIS — X58XXXA Exposure to other specified factors, initial encounter: Secondary | ICD-10-CM | POA: Insufficient documentation

## 2021-08-28 DIAGNOSIS — D72829 Elevated white blood cell count, unspecified: Secondary | ICD-10-CM | POA: Insufficient documentation

## 2021-08-28 DIAGNOSIS — R109 Unspecified abdominal pain: Secondary | ICD-10-CM | POA: Diagnosis not present

## 2021-08-28 LAB — COMPREHENSIVE METABOLIC PANEL
ALT: 22 U/L (ref 0–44)
AST: 24 U/L (ref 15–41)
Albumin: 4.2 g/dL (ref 3.5–5.0)
Alkaline Phosphatase: 67 U/L (ref 38–126)
Anion gap: 8 (ref 5–15)
BUN: 20 mg/dL (ref 6–20)
CO2: 27 mmol/L (ref 22–32)
Calcium: 9.2 mg/dL (ref 8.9–10.3)
Chloride: 103 mmol/L (ref 98–111)
Creatinine, Ser: 1.2 mg/dL (ref 0.61–1.24)
GFR, Estimated: >60 mL/min (ref >60)
Glucose, Bld: 100 mg/dL — ABNORMAL HIGH (ref 70–99)
Potassium: 4.1 mmol/L (ref 3.5–5.1)
Sodium: 138 mmol/L (ref 135–145)
Total Bilirubin: 0.4 mg/dL (ref 0.3–1.2)
Total Protein: 8 g/dL (ref 6.5–8.1)

## 2021-08-28 LAB — CBC WITH DIFFERENTIAL/PLATELET
Abs Immature Granulocytes: 0.06 10*3/uL (ref 0.00–0.07)
Basophils Absolute: 0.1 10*3/uL (ref 0.0–0.1)
Basophils Relative: 0 %
Eosinophils Absolute: 0.2 10*3/uL (ref 0.0–0.5)
Eosinophils Relative: 2 %
HCT: 42.7 % (ref 39.0–52.0)
Hemoglobin: 14.8 g/dL (ref 13.0–17.0)
Immature Granulocytes: 1 %
Lymphocytes Relative: 38 %
Lymphs Abs: 4.3 10*3/uL — ABNORMAL HIGH (ref 0.7–4.0)
MCH: 31.9 pg (ref 26.0–34.0)
MCHC: 34.7 g/dL (ref 30.0–36.0)
MCV: 92 fL (ref 80.0–100.0)
Monocytes Absolute: 0.8 10*3/uL (ref 0.1–1.0)
Monocytes Relative: 7 %
Neutro Abs: 5.9 10*3/uL (ref 1.7–7.7)
Neutrophils Relative %: 52 %
Platelets: 287 10*3/uL (ref 150–400)
RBC: 4.64 MIL/uL (ref 4.22–5.81)
RDW: 12.1 % (ref 11.5–15.5)
WBC: 11.4 10*3/uL — ABNORMAL HIGH (ref 4.0–10.5)
nRBC: 0 % (ref 0.0–0.2)

## 2021-08-28 MED ORDER — OXYCODONE-ACETAMINOPHEN 5-325 MG PO TABS
1.0000 | ORAL_TABLET | Freq: Once | ORAL | Status: DC
  Filled 2021-08-28: qty 1

## 2021-08-28 MED ORDER — IOHEXOL 350 MG/ML SOLN
80.0000 mL | Freq: Once | INTRAVENOUS | Status: AC | PRN
  Administered 2021-08-28: 80 mL via INTRAVENOUS

## 2021-08-28 MED ORDER — KETOROLAC TROMETHAMINE 30 MG/ML IJ SOLN
INTRAMUSCULAR | Status: AC
  Filled 2021-08-28: qty 1

## 2021-08-28 MED ORDER — KETOROLAC TROMETHAMINE 15 MG/ML IJ SOLN
30.0000 mg | Freq: Once | INTRAMUSCULAR | Status: AC
  Administered 2021-08-28: 30 mg via INTRAVENOUS

## 2021-08-28 NOTE — ED Triage Notes (Signed)
Pt reports right sided back pain that started after "coughing and twisting." Pt reports the pain is worse with movement and deep breathing.  ?

## 2021-08-28 NOTE — ED Notes (Signed)
Blue top in lab 

## 2021-08-29 MED ORDER — DOXYCYCLINE HYCLATE 100 MG PO TABS
100.0000 mg | ORAL_TABLET | Freq: Once | ORAL | Status: AC
  Administered 2021-08-29: 100 mg via ORAL
  Filled 2021-08-29: qty 1

## 2021-08-29 MED ORDER — PREDNISONE 50 MG PO TABS
60.0000 mg | ORAL_TABLET | Freq: Once | ORAL | Status: AC
  Administered 2021-08-29: 60 mg via ORAL
  Filled 2021-08-29: qty 1

## 2021-08-29 MED ORDER — LIDOCAINE 4 % EX PTCH: 1.0000 | MEDICATED_PATCH | CUTANEOUS | 0 refills | Status: AC

## 2021-08-29 MED ORDER — PREDNISONE 20 MG PO TABS: 40.0000 mg | ORAL_TABLET | Freq: Every day | ORAL | 0 refills | Status: AC

## 2021-08-29 MED ORDER — LIDOCAINE 5 % EX PTCH
1.0000 | MEDICATED_PATCH | CUTANEOUS | Status: DC
  Administered 2021-08-29: 1 via TRANSDERMAL
  Filled 2021-08-29: qty 1

## 2021-08-29 MED ORDER — ALBUTEROL SULFATE HFA 108 (90 BASE) MCG/ACT IN AERS: 1.0000 | INHALATION_SPRAY | Freq: Four times a day (QID) | RESPIRATORY_TRACT | 0 refills | Status: AC | PRN

## 2021-08-29 MED ORDER — IPRATROPIUM-ALBUTEROL 0.5-2.5 (3) MG/3ML IN SOLN: 3.0000 mL | Freq: Four times a day (QID) | RESPIRATORY_TRACT | 0 refills | Status: AC | PRN

## 2021-08-29 MED ORDER — IPRATROPIUM-ALBUTEROL 0.5-2.5 (3) MG/3ML IN SOLN
3.0000 mL | Freq: Once | RESPIRATORY_TRACT | Status: AC
  Administered 2021-08-29: 3 mL via RESPIRATORY_TRACT
  Filled 2021-08-29: qty 3

## 2021-08-29 MED ORDER — DOXYCYCLINE HYCLATE 100 MG PO CAPS: 100.0000 mg | ORAL_CAPSULE | Freq: Two times a day (BID) | ORAL | 0 refills | Status: AC

## 2021-08-29 NOTE — Discharge Instructions (Addendum)
You were seen in the emergency department for evaluation of your right sided back pain.  You have a rib fracture and also pneumonia.  I think this is likely from your COPD.  Please follow directions on respiratory therapy gave you with a flutter valve and performing good pulmonary exercises prevent any worsening pneumonia or atelectasis.  I have prescribed you doxycycline to take twice daily for the next week.  I have also prescribed you prednisone to take once daily for the next 5 days.  Additionally, I prescribed you nebulizer solutions and also an albuterol inhaler.  Please take these as directed and as needed.  He will be given your first dose of prednisone and doxycycline while in the emergency department.  It is important to follow-up with your primary care or your COPD.  If you have any worsening chest pain, shortness of breath, pain, fever, lightheadedness, please return to the nearest emergency department for evaluation. ? ?Contact a doctor if: ?You cough up more mucus than usual. ?There is a change in the color or thickness of the mucus. ?It is harder to breathe than usual. ?Your breathing is faster than usual. ?You have trouble sleeping. ?You need to use your medicines more often than usual. ?You have trouble doing your normal activities such as getting dressed or walking around the house. ?Get help right away if: ?You have shortness of breath while resting. ?You have shortness of breath that stops you from: ?Being able to talk. ?Doing normal activities. ?Your chest hurts for longer than 5 minutes. ?Your skin color is more blue than usual. ?Your pulse oximeter shows that you have low oxygen for longer than 5 minutes. ?You have a fever. ?You feel too tired to breathe normally. ?These symptoms may represent a serious problem that is an emergency. Do not wait to see if the symptoms will go away. Get medical help right away. Call your local emergency services (911 in the [place] Do not drive yourself to the  hospital. ?

## 2021-08-29 NOTE — ED Provider Notes (Addendum)
?[facility] EMERGENCY DEPARTMENT ?Provider Note ? ? ?CSN: [id number] ?Arrival date & time: 08/28/21  1830 ? ?  ? ?History ?Chief Complaint  ?Patient presents with  ? Back Pain  ? ? ?Mark Parks is a 51 y.o. male with history of COPD presents emergency department for evaluation of right-sided flank pain.  Patient reports he has had this right-sided flank pain for the past week, although today when he was getting into his truck, he coughed and felt a sharp pain that was severe around 1730 today.  The patient reports he does have a history of rib fractures in the past.  He denies any chest pain or shortness of breath, however mentions it hurts his back to take a deep breath been.  Denies any leg swelling, heart palpitations, fever.  He reports he has had a chronic off-and-on cough.  He is currently on methadone.  Denies any IV drug use ever.  Patient reports year #7 mention in June that he has COPD, did not follow-up with them.  Reports he has not seen a primary care doctor in over 2 years.  He does not take any daily medications.  He is allergic to penicillins.  He has a 40 pack/year history.  Denies any EtOH or drugs. ? ? ?Back Pain ?Associated symptoms: no abdominal pain, no chest pain and no fever   ? ?  ? ?Home Medications ?Prior to Admission medications   ?Not on File  ?   ? ?Allergies    ?Penicillins   ? ?Review of Systems   ?Review of Systems  ?Constitutional:  Negative for fever.  ?HENT:  Negative for congestion and rhinorrhea.   ?Respiratory:  Positive for cough. Negative for shortness of breath.   ?Cardiovascular:  Negative for chest pain, palpitations and leg swelling.  ?Gastrointestinal:  Negative for abdominal pain, constipation, diarrhea, nausea and vomiting.  ?Genitourinary:  Positive for flank pain.  ?Musculoskeletal:  Positive for back pain.  ? ?Physical Exam ?Updated Vital Signs ?BP (!) 153/92 (BP Location: Right Arm)   Pulse 66   Temp 98 ?F (36.7 ?C) (Oral)   Resp 18   SpO2 100%   ?Physical Exam ?Vitals and nursing note reviewed.  ?Constitutional:   ?   General: He is not in acute distress. ?   Appearance: He is not toxic-appearing.  ?HENT:  ?   Head: Normocephalic and atraumatic.  ?   Mouth/Throat:  ?   Mouth: Mucous membranes are moist.  ?Eyes:  ?   General: No scleral icterus. ?   Extraocular Movements: Extraocular movements intact.  ?   Pupils: Pupils are equal, round, and reactive to light.  ?Cardiovascular:  ?   Rate and Rhythm: Normal rate and regular rhythm.  ?   Pulses: Normal pulses.  ?   Comments: DP, PT, and radial pulses intact, equal, and palpable. ?Pulmonary:  ?   Effort: Pulmonary effort is normal.  ?   Comments: Significant expiratory wheezing throughout all lung fields.  The patient is speaking in full sentences, satting well on room air without any increased work of breathing.  No respiratory distress, accessory muscle use, nasal flaring, tripoding, or cyanosis present. ?Chest:  ?   Chest wall: No tenderness.  ?Abdominal:  ?   General: Bowel sounds are normal.  ?   Palpations: Abdomen is soft.  ?   Tenderness: There is no abdominal tenderness. There is no guarding or rebound.  ?Musculoskeletal:     ?   General: Tenderness  present. No deformity.  ?   Cervical back: Normal range of motion. No tenderness.  ?   Comments: Patient has point tenderness to his right flank overlying ribs.  I do not palpate or visualize any step-off or deformities.  There is no overlying skin changes noted.  No increased erythema or warmth to the area.  No signs of trauma.  No midline cervical, thoracic, or lumbar tenderness.  ?Lymphadenopathy:  ?   Cervical: No cervical adenopathy.  ?Skin: ?   General: Skin is warm and dry.  ?Neurological:  ?   General: No focal deficit present.  ?   Mental Status: He is alert. Mental status is at baseline.  ?   Cranial Nerves: No cranial nerve deficit.  ?   Motor: No weakness.  ? ? ?ED Results / Procedures / Treatments   ?Labs ?(all labs ordered are listed, but  only abnormal results are displayed) ?Labs Reviewed  ?CBC WITH DIFFERENTIAL/PLATELET - Abnormal; Notable for the following components:  ?    Result Value  ? WBC 11.4 (*)   ? Lymphs Abs 4.3 (*)   ? All other components within normal limits  ?COMPREHENSIVE METABOLIC PANEL - Abnormal; Notable for the following components:  ? Glucose, Bld 100 (*)   ? All other components within normal limits  ? ? ?EKG ?None ? ?Radiology ?DG Chest 2 View ? ?Result Date: 08/28/2021 ?CLINICAL DATA:  Right posterior rib and back pain EXAM: CHEST - 2 VIEW COMPARISON:  12/31/2009 FINDINGS: Visualization of the lateral radiograph is somewhat limited by patient's arm position. Cardiac and mediastinal contours are within normal limits. No focal pulmonary opacity. No pleural effusion or pneumothorax. No displaced rib fracture is seen. No acute osseous abnormality. IMPRESSION: No acute cardiopulmonary process.  No displaced rib fracture. Electronically Signed   By: Wiliam Ke M.D.   On: 08/28/2021 19:22  ? ?CT Angio Chest PE W and/or Wo Contrast ? ?Addendum Date: 08/29/2021   ?ADDENDUM REPORT: 08/29/2021 00:43 ADDENDUM: There is a nondisplaced posterior right ninth rib fracture which appears acute or subacute. Electronically Signed   By: Darliss Cheney M.D.   On: 08/29/2021 00:43  ? ?Result Date: 08/29/2021 ?CLINICAL DATA:  Right-sided pain with coughing. EXAM: CT ANGIOGRAPHY CHEST WITH CONTRAST TECHNIQUE: Multidetector CT imaging of the chest was performed using the standard protocol during bolus administration of intravenous contrast. Multiplanar CT image reconstructions and MIPs were obtained to evaluate the vascular anatomy. RADIATION DOSE REDUCTION: This exam was performed according to the departmental dose-optimization program which includes automated exposure control, adjustment of the mA and/or kV according to patient size and/or use of iterative reconstruction technique. CONTRAST:  20mL OMNIPAQUE IOHEXOL 350 MG/ML SOLN COMPARISON:  None.  FINDINGS: Cardiovascular: Satisfactory opacification of the pulmonary arteries to the segmental level. No evidence of pulmonary embolism. Normal heart size. No pericardial effusion. Mediastinum/Nodes: No enlarged mediastinal, hilar, or axillary lymph nodes. Thyroid gland, trachea, and esophagus demonstrate no significant findings. Lungs/Pleura: Mild emphysematous changes are present. There some patchy ground-glass opacities in the inferior right lower lobe. There are minimal atelectatic changes in the left lung base. No pleural effusion or pneumothorax. Upper Abdomen: No acute abnormality. Musculoskeletal: No chest wall abnormality. No acute or significant osseous findings. Review of the MIP images confirms the above findings. IMPRESSION: 1. No evidence for pulmonary embolism. 2. Patchy ground-glass opacities in the right lower lobe inferiorly, likely infectious/inflammatory. 3.  Emphysema (ICD10-J43.9). Electronically Signed: By: Darliss Cheney M.D. On: 08/29/2021 00:09   ? ?  Procedures ?Procedures  ? ?Medications Ordered in ED ?Medications  ?ketorolac (TORADOL) 15 MG/ML injection 30 mg (30 mg Intravenous Given 08/28/21 2323)  ?ketorolac (TORADOL) 30 MG/ML injection (  Given 08/28/21 2329)  ?iohexol (OMNIPAQUE) 350 MG/ML injection 80 mL (80 mLs Intravenous Contrast Given 08/28/21 2337)  ?ipratropium-albuterol (DUONEB) 0.5-2.5 (3) MG/3ML nebulizer solution 3 mL (3 mLs Nebulization Given 08/29/21 0124)  ?predniSONE (DELTASONE) tablet 60 mg (60 mg Oral Given 08/29/21 0157)  ?doxycycline (VIBRA-TABS) tablet 100 mg (100 mg Oral Given 08/29/21 0157)  ? ? ?ED Course/ Medical Decision Making/ A&P ?  ?                        ?Medical Decision Making ?Amount and/or Complexity of Data Reviewed ?Labs: ordered. ?Radiology: ordered. ? ?Risk ?OTC drugs. ?Prescription drug management. ? ? ?51 year old male presents to the emergency department for evaluation of right flank pain for the past week but worsening after a cough today.   Differential diagnosis includes was not limited to pneumonia, pneumothorax, rib fracture, COPD exacerbation, PE, muscle strain/sprain.  Vital signs show mild elevated hypertension with 148/82, however the patient is afeb

## 2023-06-06 IMAGING — CT CT ANGIO CHEST
2 of 8 series · 17 of 36 positions shown · IV contrast (Omnipaque)
Comparison: None.
COMPARISON: None.

Addendum:
CLINICAL DATA: Right-sided pain with coughing.

EXAM:
CT ANGIOGRAPHY CHEST WITH CONTRAST
TECHNIQUE: Multidetector CT imaging of the chest was performed using the
standard protocol during bolus administration of intravenous
contrast. Multiplanar CT image reconstructions and MIPs were
obtained to evaluate the vascular anatomy.

[Series 9: pe thins · axial · 0.98mm/px · z∈[-528,-240]mm · 16 of 322 slices shown]
[im 17/322  lung]
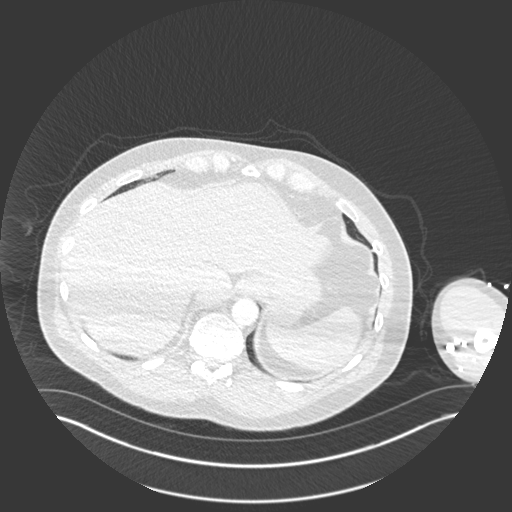
[im 34/322  mediastinal]
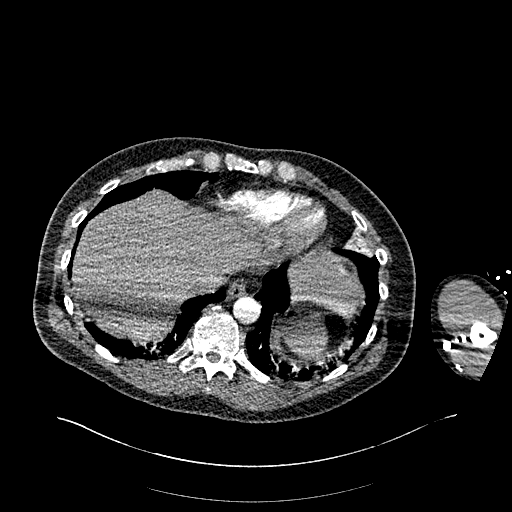
[im 51/322  lung]
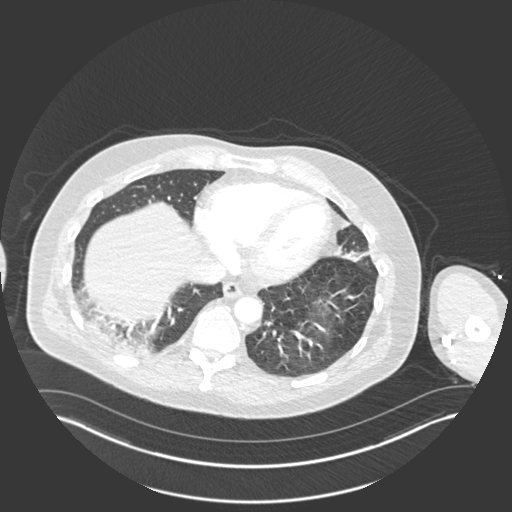
[im 68/322  mediastinal]
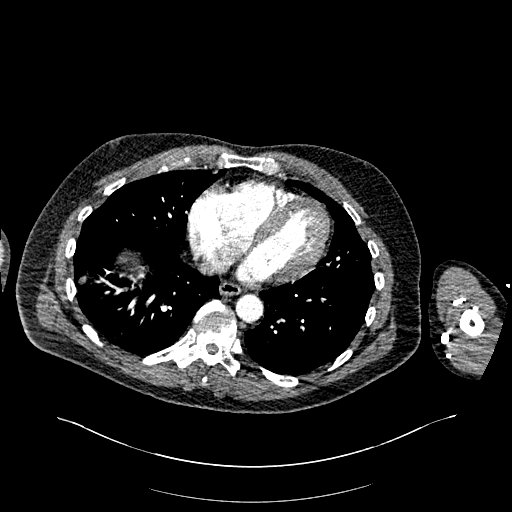
[im 102/322  lung]
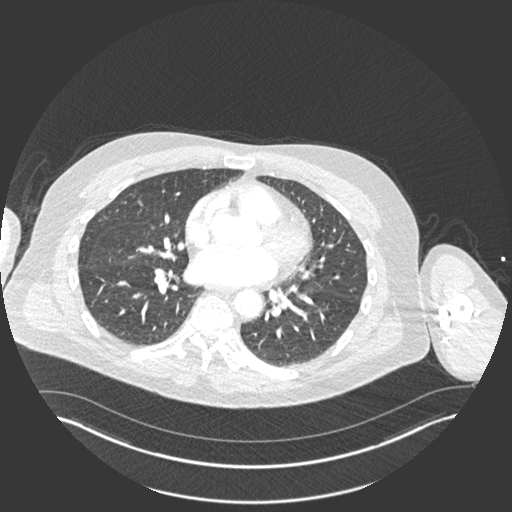
[im 119/322  mediastinal]
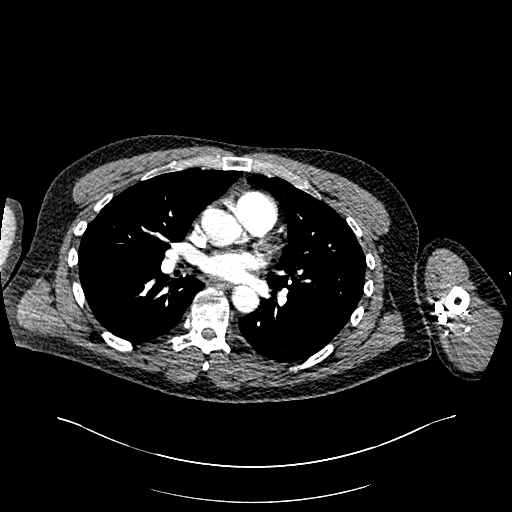
[im 136/322  lung]
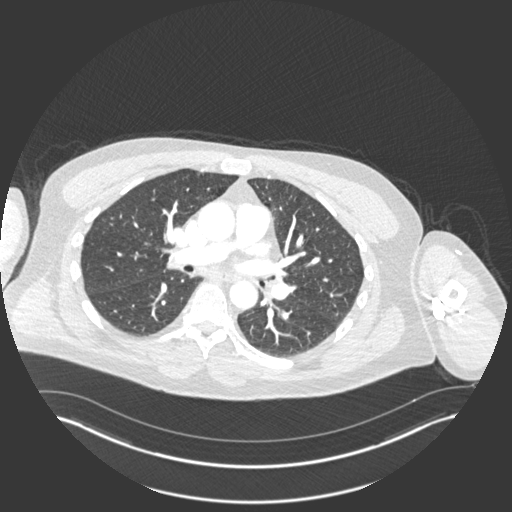
[im 153/322  mediastinal]
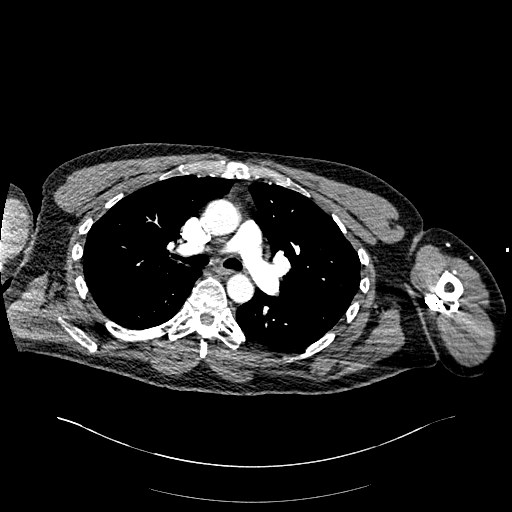
[im 169/322  lung]
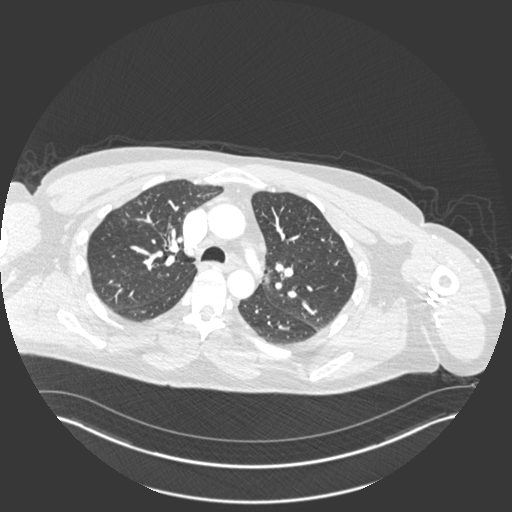
[im 186/322  mediastinal]
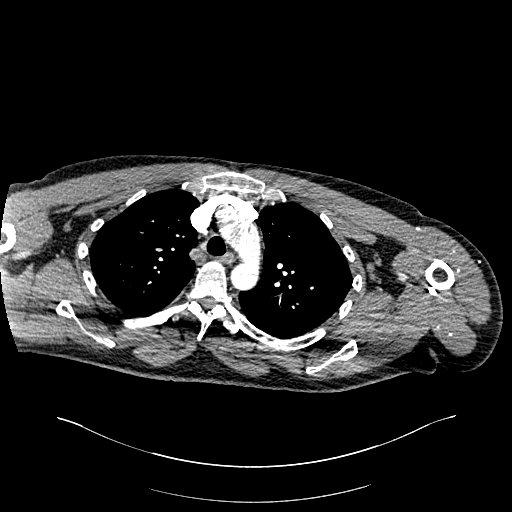
[im 203/322  lung]
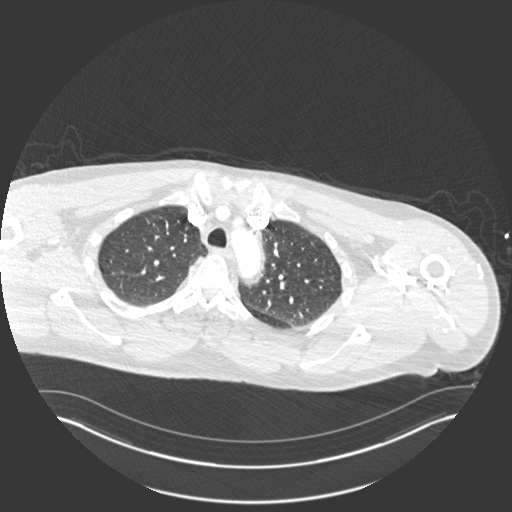
[im 220/322  mediastinal]
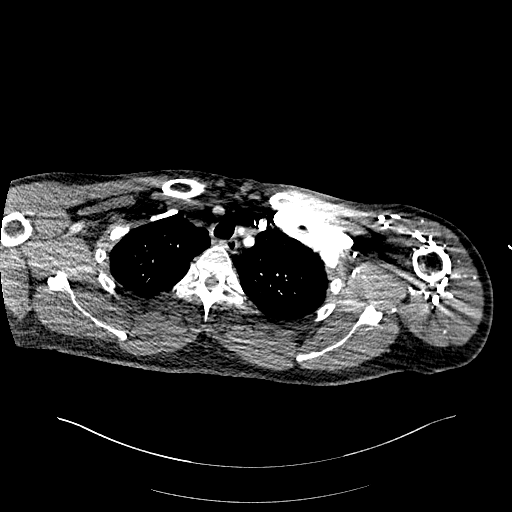
[im 254/322  lung]
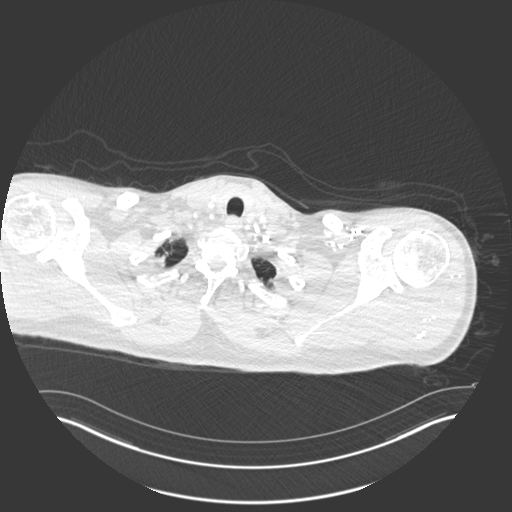
[im 271/322  mediastinal]
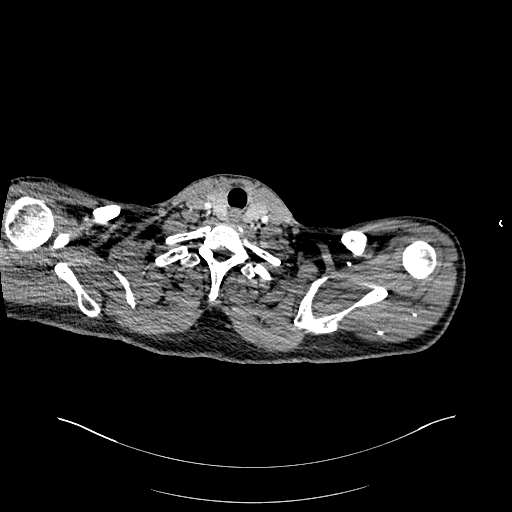
[im 288/322  lung]
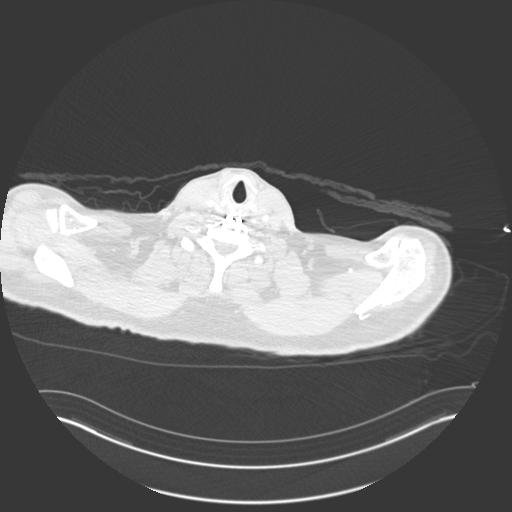
[im 305/322  mediastinal]
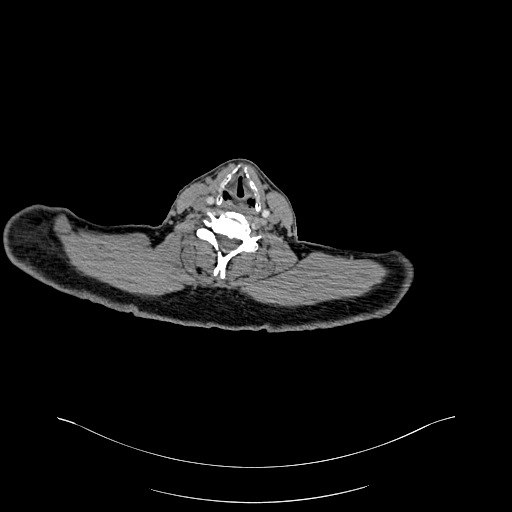

[Series 10: pe coronal mpr · coronal · 0.63mm/px · 1 of 151 slices shown]
[im 76/151  mediastinal]
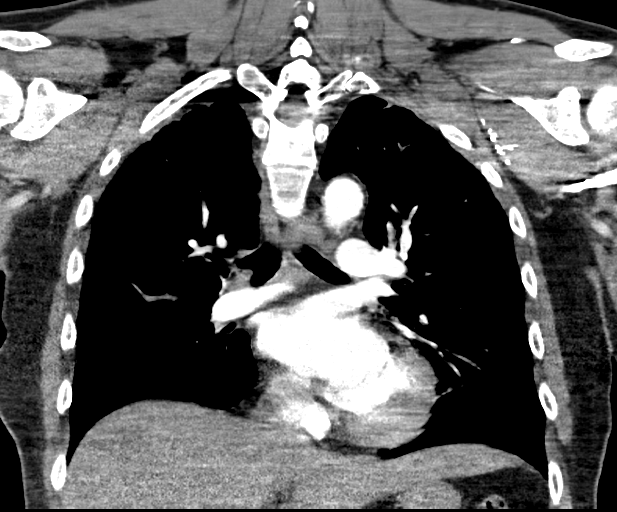

[17 of 36 positions shown; findings below may reference images not displayed]

RADIATION DOSE REDUCTION: This exam was performed according to the
departmental dose-optimization program which includes automated
exposure control, adjustment of the mA and/or kV according to
patient size and/or use of iterative reconstruction technique.

CONTRAST:  80mL OMNIPAQUE IOHEXOL 350 MG/ML SOLN
FINDINGS: Cardiovascular: Satisfactory opacification of the pulmonary arteries
to the segmental level. No evidence of pulmonary embolism. Normal
heart size. No pericardial effusion.

Mediastinum/Nodes: No enlarged mediastinal, hilar, or axillary lymph
nodes. Thyroid gland, trachea, and esophagus demonstrate no
significant findings.

Lungs/Pleura: Mild emphysematous changes are present. There some
patchy ground-glass opacities in the inferior right lower lobe.
There are minimal atelectatic changes in the left lung base. No
pleural effusion or pneumothorax.

Upper Abdomen: No acute abnormality.

Musculoskeletal: No chest wall abnormality. No acute or significant
osseous findings.

Review of the MIP images confirms the above findings.
IMPRESSION: 1. No evidence for pulmonary embolism.
2. Patchy ground-glass opacities in the right lower lobe inferiorly,
likely infectious/inflammatory.
3.  Emphysema ([id number]).

ADDENDUM:
There is a nondisplaced posterior right ninth rib fracture which
appears acute or subacute.

*** End of Addendum ***
RADIATION DOSE REDUCTION: This exam was performed according to the
departmental dose-optimization program which includes automated
exposure control, adjustment of the mA and/or kV according to
patient size and/or use of iterative reconstruction technique.

CONTRAST:  80mL OMNIPAQUE IOHEXOL 350 MG/ML SOLN
FINDINGS: Cardiovascular: Satisfactory opacification of the pulmonary arteries
to the segmental level. No evidence of pulmonary embolism. Normal
heart size. No pericardial effusion.

Mediastinum/Nodes: No enlarged mediastinal, hilar, or axillary lymph
nodes. Thyroid gland, trachea, and esophagus demonstrate no
significant findings.

Lungs/Pleura: Mild emphysematous changes are present. There some
patchy ground-glass opacities in the inferior right lower lobe.
There are minimal atelectatic changes in the left lung base. No
pleural effusion or pneumothorax.

Upper Abdomen: No acute abnormality.

Musculoskeletal: No chest wall abnormality. No acute or significant
osseous findings.

Review of the MIP images confirms the above findings.
IMPRESSION: 1. No evidence for pulmonary embolism.
2. Patchy ground-glass opacities in the right lower lobe inferiorly,
likely infectious/inflammatory.
3.  Emphysema ([id number]).
